# Patient Record
Sex: Female | Born: 2000 | Hispanic: Yes | Smoking: Former smoker
Health system: Southern US, Community
[De-identification: ages and names within clinical notes are randomized; demographics above are authoritative.]

## PROBLEM LIST (undated history)

## (undated) DIAGNOSIS — E669 Obesity, unspecified: Secondary | ICD-10-CM

## (undated) HISTORY — DX: Obesity, unspecified: E66.9

## (undated) HISTORY — PX: OTHER SURGICAL HISTORY: SHX169

---
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
## 2019-04-21 ENCOUNTER — Ambulatory Visit: Payer: Self-pay

## 2019-10-01 ENCOUNTER — Ambulatory Visit: Payer: Self-pay

## 2019-10-22 ENCOUNTER — Ambulatory Visit: Payer: Medicaid Other

## 2023-07-29 DIAGNOSIS — Z3483 Encounter for supervision of other normal pregnancy, third trimester: Secondary | ICD-10-CM | POA: Diagnosis not present

## 2023-12-27 ENCOUNTER — Emergency Department

## 2023-12-27 ENCOUNTER — Emergency Department
Admission: EM | Admit: 2023-12-27 | Discharge: 2023-12-27 | Disposition: A | Discharge status: Home / Self Care | Attending: Emergency Medicine | Admitting: Emergency Medicine

## 2023-12-27 ENCOUNTER — Other Ambulatory Visit: Payer: Self-pay

## 2023-12-27 DIAGNOSIS — Z3A01 Less than 8 weeks gestation of pregnancy: Secondary | ICD-10-CM | POA: Diagnosis not present

## 2023-12-27 DIAGNOSIS — O209 Hemorrhage in early pregnancy, unspecified: Secondary | ICD-10-CM | POA: Diagnosis present

## 2023-12-27 DIAGNOSIS — Z3491 Encounter for supervision of normal pregnancy, unspecified, first trimester: Secondary | ICD-10-CM

## 2023-12-27 DIAGNOSIS — O4690 Antepartum hemorrhage, unspecified, unspecified trimester: Secondary | ICD-10-CM

## 2023-12-27 LAB — CBC
HCT: 40.1 % (ref 36.0–46.0)
Hemoglobin: 13.2 g/dL (ref 12.0–15.0)
MCH: 28.8 pg (ref 26.0–34.0)
MCHC: 32.9 g/dL (ref 30.0–36.0)
MCV: 87.4 fL (ref 80.0–100.0)
Platelets: 261 10*3/uL (ref 150–400)
RBC: 4.59 MIL/uL (ref 3.87–5.11)
RDW: 12.1 % (ref 11.5–15.5)
WBC: 7.6 10*3/uL (ref 4.0–10.5)
nRBC: 0 % (ref 0.0–0.2)

## 2023-12-27 LAB — HCG, QUANTITATIVE, PREGNANCY: hCG, Beta Chain, Quant, S: 7801 m[IU]/mL — ABNORMAL HIGH (ref <5)

## 2023-12-27 LAB — POC URINE PREG, ED: Preg Test, Ur: POSITIVE — AB

## 2023-12-27 NOTE — Discharge Instructions (Addendum)
Please start taking prenatal vitamins daily.

## 2023-12-27 NOTE — ED Notes (Signed)
 Patient taken to ultrasound.

## 2023-12-27 NOTE — ED Triage Notes (Signed)
 Pt here with vaginal bleeding. Pt states she took 2 at home pregnancy test that were postive. Last LMP was October 28, 2023. Pt states she woke up this morning with spotting, denies heavy bleeding. Pt denies NVD.

## 2023-12-27 NOTE — ED Provider Notes (Signed)
 [facility] Provider Note   Event Date/Time   First MD Initiated Contact with Patient 12/27/23 1210     (approximate) History  Vaginal Bleeding  HPI Crystal Perez is a 23 y.o. female with no stated past medical history presents for vaginal bleeding that began last night into today.  Patient denies heavy bleeding.  Patient states last menstrual cycle was October 28, 2023.  Patient has taken 2 home urine pregnancy test that have come back positive.  Patient denies any abdominal pain ROS: Patient currently denies any vision changes, tinnitus, difficulty speaking, facial droop, sore throat, chest pain, shortness of breath, nausea/vomiting/diarrhea, dysuria, or weakness/numbness/paresthesias in any extremity   Physical Exam  Triage Vital Signs: ED Triage Vitals [12/27/23 1150]  Encounter Vitals Group     BP (!) 142/96     Girls Systolic BP Percentile      Girls Diastolic BP Percentile      Boys Systolic BP Percentile      Boys Diastolic BP Percentile      Pulse Rate 97     Resp 16     Temp 98 F (36.7 C)     Temp Source Oral     SpO2 100 %     Weight 169 lb 15.6 oz (77.1 kg)     Height 5' 4 (1.626 m)     Head Circumference      Peak Flow      Pain Score 4     Pain Loc      Pain Education      Exclude from Growth Chart    Most recent vital signs: Vitals:   12/27/23 1150  BP: (!) 142/96  Pulse: 97  Resp: 16  Temp: 98 F (36.7 C)  SpO2: 100%   General: Awake, oriented x4. CV:  Good peripheral perfusion. Resp:  Normal effort. Abd:  No distention. Other:  Young adult overweight Hispanic female resting comfortably in no acute distress ED Results / Procedures / Treatments  Labs (all labs ordered are listed, but only abnormal results are displayed) Labs Reviewed  HCG, QUANTITATIVE, PREGNANCY - Abnormal; Notable for the following components:      Result Value   hCG, Beta Chain, Quant, S 7,801 (*)    All other components within normal  limits  POC URINE PREG, ED - Abnormal; Notable for the following components:   Preg Test, Ur Positive (*)    All other components within normal limits  CBC  RADIOLOGY ED MD interpretation: Transvaginal ultrasound shows a single living intrauterine pregnancy with estimated gestational age of [redacted] weeks 4 days as well as a small subchorionic hemorrhage - All radiology independently interpreted and agree with radiology assessment Official radiology report(s): US  OB LESS THAN 14 WEEKS WITH OB TRANSVAGINAL Result Date: 12/27/2023 CLINICAL DATA:  Vaginal bleeding in 1st trimester pregnancy. EXAM: OBSTETRIC <14 WK US  AND TRANSVAGINAL OB US  TECHNIQUE: Both transabdominal and transvaginal ultrasound examinations were performed for complete evaluation of the gestation as well as the maternal uterus, adnexal regions, and pelvic cul-de-sac. Transvaginal technique was performed to assess early pregnancy. COMPARISON:  None Available. FINDINGS: Intrauterine gestational sac: Single Yolk sac:  Visualized. Embryo:  Visualized. Cardiac Activity: Visualized. Heart Rate: 127 bpm CRL:  7 mm   6 w   4 d                  US  EDC: 08/17/2024 Subchorionic hemorrhage:  Small subchorionic hemorrhage noted. Maternal uterus/adnexae: Retroverted uterus noted. Small  right ovarian corpus luteum noted. Normal appearance of left ovary. No adnexal mass or abnormal free fluid identified. IMPRESSION: Single living IUP with estimated gestational age of [redacted] weeks 4 days, and US  EDC of 08/17/2024. Small subchorionic hemorrhage. Electronically Signed   By: Marlyce Sine M.D.   On: 12/27/2023 14:18   PROCEDURES: Critical Care performed: No Procedures MEDICATIONS ORDERED IN ED: Medications - No data to display IMPRESSION / MDM / ASSESSMENT AND PLAN / ED COURSE  I reviewed the triage vital signs and the nursing notes.                             The patient is on the cardiac monitor to evaluate for evidence of arrhythmia and/or significant heart  rate changes. Patient's presentation is most consistent with acute presentation with potential threat to life or bodily function. Workup: CBC, BMP, UA, bHCG, Type&Screen, 1st Trimester Ultrasound  Based on History, Exam, and ED Workup patient's presentation not consistent with ectopic pregnancy, molar pregnancy, life-threatening coagulopathy, trauma, serious bacterial infection, central process or other emergency.  Disposition: Will discharge home with return precautions and instruction for prompt OBGYN follow up.   FINAL CLINICAL IMPRESSION(S) / ED DIAGNOSES   Final diagnoses:  Vaginal bleeding in pregnancy  First trimester pregnancy   Rx / DC Orders   ED Discharge Orders     None      Note:  This document was prepared using Dragon voice recognition software and may include unintentional dictation errors.   Michaell Grider K, MD 12/27/23 [phone]

## 2023-12-27 NOTE — ED Notes (Signed)
 Patient declined discharge vital signs.

## 2024-01-31 ENCOUNTER — Encounter

## 2024-02-01 ENCOUNTER — Encounter: Payer: Self-pay | Admitting: Nurse Practitioner

## 2024-02-01 ENCOUNTER — Ambulatory Visit: Payer: Self-pay

## 2024-02-01 ENCOUNTER — Ambulatory Visit: Admitting: Nurse Practitioner

## 2024-02-01 VITALS — BP 117/73 | HR 87 | Temp 97.9°F | Ht 64.0 in | Wt 201.6 lb

## 2024-02-01 DIAGNOSIS — Z3A11 11 weeks gestation of pregnancy: Secondary | ICD-10-CM | POA: Diagnosis not present

## 2024-02-01 DIAGNOSIS — O99211 Obesity complicating pregnancy, first trimester: Secondary | ICD-10-CM | POA: Diagnosis not present

## 2024-02-01 DIAGNOSIS — Z3481 Encounter for supervision of other normal pregnancy, first trimester: Secondary | ICD-10-CM

## 2024-02-01 DIAGNOSIS — O99210 Obesity complicating pregnancy, unspecified trimester: Secondary | ICD-10-CM | POA: Insufficient documentation

## 2024-02-01 DIAGNOSIS — Z3480 Encounter for supervision of other normal pregnancy, unspecified trimester: Secondary | ICD-10-CM | POA: Insufficient documentation

## 2024-02-01 DIAGNOSIS — Z8759 Personal history of other complications of pregnancy, childbirth and the puerperium: Secondary | ICD-10-CM

## 2024-02-01 LAB — WET PREP FOR TRICH, YEAST, CLUE
Clue Cell Exam: NEGATIVE
Trichomonas Exam: NEGATIVE
Yeast Exam: NEGATIVE

## 2024-02-01 LAB — HEMOGLOBIN, FINGERSTICK: Hemoglobin: 12.4 g/dL (ref 11.1–15.9)

## 2024-02-01 MED ORDER — ASPIRIN 81 MG PO TBEC: 81.0000 mg | DELAYED_RELEASE_TABLET | Freq: Every day | ORAL | Status: DC

## 2024-02-01 MED ORDER — BLOOD PRESSURE KIT: 1.0000 | PACK | Freq: Every day | 0 refills | Status: AC

## 2024-02-01 NOTE — Progress Notes (Signed)
 [facility] [address] [place] [address] Main phone: [phone]  Initial Prenatal Visit  Subjective:  Crystal Perez is a 23 y.o. G3P1011 at [redacted]w[redacted]d being seen today to start prenatal care at the [facility] Department. The following medical issues will be considered in the care of this low-risk pregnancy:   Patient Active Problem List   Diagnosis Date Noted   Prenatal care, subsequent pregnancy, antepartum 02/01/2024   Obesity affecting pregnancy, antepartum 02/01/2024   History of gestational hypertension 02/01/2024   Patient reports nausea.  Contractions: Not present. Vag. Bleeding: None.  Movement: Absent. Denies leaking of fluid.   Indications for ASA therapy One of the following: Previous pregnancy with preeclampsia, especially early onset and with an adverse outcome Yes  Multifetal gestation No  Chronic hypertension No  Type 1 or 2 diabetes mellitus No  Chronic kidney disease No  Autoimmune disease (antiphospholipid syndrome, systemic lupus erythematosus) No   Two or more of the following: Nulliparity  No  Obesity (body mass index >30 kg/m2) Yes  Family history of preeclampsia in mother or sister No  Age >=35 years No  Sociodemographic characteristics (African American race, low socioeconomic level) Yes  Personal risk factors (eg, previous pregnancy with low birth weight or small for gestational age infant, previous adverse pregnancy outcome [eg, stillbirth], interval >10 years between pregnancies) No   The following portions of the patient's history were reviewed and updated as appropriate: allergies, current medications, past family history, past medical history, past social history, past surgical history and problem list. Problem list updated.  Objective:   Vitals:   02/01/24 0848  BP: 117/73  Pulse: 87  Temp: 97.9 F (36.6 C)  Weight: 201 lb 9.6 oz (91.4 kg)   Fetal  Status: Fetal Heart Rate (bpm): 173   Movement: Absent      Physical Exam Vitals and nursing note reviewed.  Constitutional:      General: She is not in acute distress.    Appearance: Normal appearance. She is well-developed.  HENT:     Head: Normocephalic and atraumatic.     Right Ear: External ear normal.     Left Ear: External ear normal.     Nose: Nose normal. No congestion or rhinorrhea.     Mouth/Throat:     Lips: Pink.     Mouth: Mucous membranes are moist.     Dentition: Normal dentition. No dental caries.     Pharynx: Oropharynx is clear. Uvula midline.     Comments: Dentition: intact Eyes:     General: No scleral icterus.    Conjunctiva/sclera: Conjunctivae normal.  Neck:     Thyroid: No thyroid mass or thyromegaly.  Cardiovascular:     Rate and Rhythm: Normal rate.     Pulses: Normal pulses.     Comments: Extremities are warm and well perfused Pulmonary:     Effort: Pulmonary effort is normal.     Breath sounds: Normal breath sounds.  Chest:     Comments: CBE not indicated Abdominal:     General: Abdomen is flat.     Palpations: Abdomen is soft.     Tenderness: There is no abdominal tenderness.     Comments: Gravid   Genitourinary:    General: Normal vulva.     Exam position: Lithotomy position.     Pubic Area: No rash.      Labia:        Right: No rash.  Left: No rash.      Vagina: Normal. No vaginal discharge.     Cervix: Friability (mild) present.     Uterus: Enlarged (gravid). Not tender.      Rectum: Normal. No external hemorrhoid.     Comments: Swabs and pap collected Musculoskeletal:     Right lower leg: No edema.     Left lower leg: No edema.  Lymphadenopathy:     Cervical: No cervical adenopathy.  Skin:    General: Skin is warm.     Capillary Refill: Capillary refill takes less than 2 seconds.  Neurological:     Mental Status: She is alert.     Assessment and Plan:  Pregnancy: G3P1011 at [redacted]w[redacted]d  1. Prenatal care, subsequent  pregnancy, antepartum (Primary) Reviewed recommended weight gain: 11-20lbs. Discussed healthy diet (avoiding soda, good sources of protein, fruits, and vegetables, lots of water) and exercising. Endorses mild nausea in the morning. Enc frequent snacks. Rec doxylamine/vit b6. Given sheet. Pap due today and collected Last dental visit 3 years ago. Encouraged to seek dental care this pregnancy. EPDS: 0 Dating US  on 12/27/23 @ [redacted]w[redacted]d  She lives with her husband and her 5yo son, who is very excited about this pregnancy. She does not work currently; her husband is an Personnel officer.   - Prenatal Profile I - MaterniT 21 plus Core, Blood - Hgb Fractionation Cascade - Lead, blood (adult age 42 yrs or greater) - Comprehensive metabolic panel with GFR - Hemoglobin, fingerstick - WET PREP FOR TRICH, YEAST, CLUE - IGP, rfx Aptima HPV ASCU  2. [redacted] weeks gestation of pregnancy Discussed updated ED w pt  3. Obesity affecting pregnancy, antepartum, unspecified obesity type  - Glucose, 1 hour - Hgb A1c w/o eAG - TSH - Protein / creatinine ratio, urine - Amb ref to Medical Nutrition Therapy-MNT  4. History of pre-eclampsia Signs and symptoms of preeclampsia were verbally reviewed with warning signs, when and how to call. A written handout with tips on how to take your blood pressure, as well as warning signs was provided to the patient.  Sent for BP kit at home and instructed to take BP daily. Pt to start aspirin in a couple days at 12w  - aspirin EC 81 MG tablet; Take 1 tablet (81 mg total) by mouth daily. Swallow whole. - Blood Pressure KIT; 1 kit by Does not apply route daily.  Dispense: 1 kit; Refill: 0    Discussed overview of care and coordination with inpatient delivery practices including [facility],  [facility].   Reviewed [facility] pregnancy as standard of care at [facility], oriented to room and showed video. Based on EDD, plan for Cycle 6 .   Preterm labor symptoms  and general obstetric precautions including but not limited to vaginal bleeding, contractions, leaking of fluid and fetal movement were reviewed in detail with the patient.  Please refer to After Visit Summary for other counseling recommendations.   Return in about 4 weeks (around 02/29/2024) for Routine PNC.  No future appointments.  Miliana Gangwer K Bennetta Rudden, NP

## 2024-02-01 NOTE — Progress Notes (Signed)
 Presents for initiation of prenatal care. 12/27/23 [facility] ED evaluation for vaginal bleeding and per client, no longer having any bleeding. US  and bhcg done at that evaluation. [place]  and denies any international travel. Desires MaterniT-21 testing today. All demographic and contact info verified as correct. Verified with client that has voicemail set up on her cell phone. Client reports Covid vaccine x2 at [facility] in 2021 (record not available). Declined vaccine today. Left arm measurement = 36 cm and needs large adult BP cuff for readings. Burnadette Lowers, RN Hgb = 12.4 and wet prep negative. No intervention required per standing order. Aspirin dispensed as per order and counseled to take 1 per day with verbalized understanding. Blood Pressure Kit #16 dispensed as ordered. Cuff in kit replaced with BP cuff measuring 22 - 42 cm. BP demo completed with client verbalizing understanding. Client counseled to bring BP log at each visit and agrees to do so. Counseled regarding CenteringPregnancy and elects to participate in Cycle 6 as in pm (Client speaks / reads [place]). Burnadette Lowers, RN

## 2024-02-02 LAB — PROTEIN / CREATININE RATIO, URINE
Creatinine, Urine: 242.6 mg/dL
Protein, Ur: 18.6 mg/dL
Protein/Creat Ratio: 77 mg/g{creat} (ref 0–200)

## 2024-02-05 LAB — IGP, RFX APTIMA HPV ASCU: PAP Smear Comment: 0

## 2024-02-06 ENCOUNTER — Encounter: Payer: Self-pay | Admitting: Family Medicine

## 2024-02-06 DIAGNOSIS — R87619 Unspecified abnormal cytological findings in specimens from cervix uteri: Secondary | ICD-10-CM | POA: Insufficient documentation

## 2024-02-06 NOTE — Progress Notes (Signed)
 PAP smear reviewed, result: ASC-H, HPV co-testing not performed. Based on this result and patient's prior cervical cancer screenings, ASCCP currently recommends colposcopy.  Dorothyann Helling, MD 02/06/24  3:59 PM

## 2024-02-08 ENCOUNTER — Telehealth: Payer: Self-pay | Admitting: Family Medicine

## 2024-02-09 ENCOUNTER — Telehealth: Payer: Self-pay

## 2024-02-09 NOTE — Telephone Encounter (Signed)
 Call to [facility] and spoke with [place] as majority of labs drawn at new OB appt 02/01/24 not yet available in EPIC. Per Rowena, MaternitT-21 result is pending which is why other labs have not yet been sent. As client has [facility] appt in [facility] on 02/11/24, Candy faxed available lab results not yet in EPIC so provider will have for review at client's appt. Burnadette Lowers, RN

## 2024-02-10 LAB — PREGNANCY, INITIAL SCREEN
Antibody Screen: NEGATIVE
Basophils Absolute: 0 x10E3/uL (ref 0.0–0.2)
Basos: 0 %
Bilirubin, UA: NEGATIVE
Chlamydia trachomatis, NAA: NEGATIVE
EOS (ABSOLUTE): 0.2 x10E3/uL (ref 0.0–0.4)
Eos: 3 %
Glucose, UA: NEGATIVE
HCV Ab: NONREACTIVE
HIV Screen 4th Generation wRfx: NONREACTIVE
Hematocrit: 38.6 % (ref 34.0–46.6)
Hemoglobin: 12.5 g/dL (ref 11.1–15.9)
Hepatitis B Surface Ag: NEGATIVE
Immature Grans (Abs): 0 x10E3/uL (ref 0.0–0.1)
Immature Granulocytes: 0 %
Ketones, UA: NEGATIVE
Leukocytes,UA: NEGATIVE
Lymphocytes Absolute: 2.1 x10E3/uL (ref 0.7–3.1)
Lymphs: 27 %
MCH: 29.7 pg (ref 26.6–33.0)
MCHC: 32.4 g/dL (ref 31.5–35.7)
MCV: 92 fL (ref 79–97)
Monocytes Absolute: 0.4 x10E3/uL (ref 0.1–0.9)
Monocytes: 5 %
Neisseria Gonorrhoeae by PCR: NEGATIVE
Neutrophils Absolute: 5.3 x10E3/uL (ref 1.4–7.0)
Neutrophils: 65 %
Nitrite, UA: NEGATIVE
Platelets: 252 x10E3/uL (ref 150–450)
RBC, UA: NEGATIVE
RBC: 4.21 x10E6/uL (ref 3.77–5.28)
RDW: 12.4 % (ref 11.7–15.4)
RPR Ser Ql: NONREACTIVE
Rh Factor: POSITIVE
Rubella Antibodies, IGG: 1.97 {index} (ref >0.99)
Specific Gravity, UA: >=1.03 — AB (ref 1.005–1.030)
Urobilinogen, Ur: 0.2 mg/dL (ref 0.2–1.0)
WBC: 8.1 x10E3/uL (ref 3.4–10.8)
pH, UA: 5.5 (ref 5.0–7.5)

## 2024-02-10 LAB — COMPREHENSIVE METABOLIC PANEL WITH GFR
ALT: 11 IU/L (ref 0–32)
AST: 12 IU/L (ref 0–40)
Albumin: 4.2 g/dL (ref 4.0–5.0)
Alkaline Phosphatase: 65 IU/L (ref 44–121)
BUN/Creatinine Ratio: 12 (ref 9–23)
BUN: 5 mg/dL — ABNORMAL LOW (ref 6–20)
Bilirubin Total: <0.2 mg/dL (ref 0.0–1.2)
CO2: 19 mmol/L — ABNORMAL LOW (ref 20–29)
Calcium: 8.9 mg/dL (ref 8.7–10.2)
Chloride: 102 mmol/L (ref 96–106)
Creatinine, Ser: 0.43 mg/dL — ABNORMAL LOW (ref 0.57–1.00)
Globulin, Total: 3 g/dL (ref 1.5–4.5)
Glucose: 118 mg/dL — ABNORMAL HIGH (ref 70–99)
Potassium: 3.7 mmol/L (ref 3.5–5.2)
Sodium: 136 mmol/L (ref 134–144)
Total Protein: 7.2 g/dL (ref 6.0–8.5)
eGFR: 140 mL/min/1.73 (ref >59)

## 2024-02-10 LAB — HGB FRACTIONATION CASCADE
Hgb A2: 2.7 % (ref 1.8–3.2)
Hgb A: 96.7 % (ref 96.4–98.8)
Hgb F: 0.6 % (ref 0.0–2.0)
Hgb S: 0 %

## 2024-02-10 LAB — MICROSCOPIC EXAMINATION
Bacteria, UA: NONE SEEN
Casts: NONE SEEN /LPF

## 2024-02-10 LAB — HCV INTERPRETATION

## 2024-02-10 LAB — GLUCOSE, 1 HOUR GESTATIONAL: Gestational Diabetes Screen: 113 mg/dL (ref 70–139)

## 2024-02-10 LAB — HGB A1C W/O EAG: Hgb A1c MFr Bld: 5.2 % (ref 4.8–5.6)

## 2024-02-10 LAB — MATERNIT 21 PLUS CORE, BLOOD

## 2024-02-10 LAB — URINE CULTURE, OB REFLEX

## 2024-02-10 LAB — LEAD, BLOOD (ADULT >= 16 YRS): Lead-Whole Blood: <1 ug/dL (ref 0.0–3.4)

## 2024-02-10 LAB — TSH: TSH: 2.22 u[IU]/mL (ref 0.450–4.500)

## 2024-02-11 ENCOUNTER — Telehealth: Payer: Self-pay

## 2024-02-11 ENCOUNTER — Ambulatory Visit: Admitting: Family Medicine

## 2024-02-11 VITALS — BP 132/84 | HR 93 | Wt 201.2 lb

## 2024-02-11 DIAGNOSIS — Z3482 Encounter for supervision of other normal pregnancy, second trimester: Secondary | ICD-10-CM

## 2024-02-11 DIAGNOSIS — O99212 Obesity complicating pregnancy, second trimester: Secondary | ICD-10-CM

## 2024-02-11 DIAGNOSIS — O99210 Obesity complicating pregnancy, unspecified trimester: Secondary | ICD-10-CM

## 2024-02-11 DIAGNOSIS — Z3480 Encounter for supervision of other normal pregnancy, unspecified trimester: Secondary | ICD-10-CM

## 2024-02-11 DIAGNOSIS — Z3A13 13 weeks gestation of pregnancy: Secondary | ICD-10-CM

## 2024-02-11 DIAGNOSIS — R030 Elevated blood-pressure reading, without diagnosis of hypertension: Secondary | ICD-10-CM | POA: Insufficient documentation

## 2024-02-11 NOTE — Progress Notes (Signed)
 Reviewed prenatal labs. CfDNA cancelled, but recollected 7/28. Otherwise unremarkable initial prenatal labs.   Dorothyann Helling, MD 02/11/24  3:44 PM

## 2024-02-11 NOTE — Assessment & Plan Note (Signed)
 BP elevated 132/84. TWG 1 lb 3.2 oz (0.544 kg). Taking prenatal vitamin daily and aspirin  81 mg daily. Next prenatal appointment in 4 weeks .  - continue to monitor BP - cfDNA and carrier screening today (too early at last appt) - paper referral faxed to [facility] for anatomy US 

## 2024-02-11 NOTE — Progress Notes (Unsigned)
 Per client, has not yet received call from [facility] nutritionist in repsonse to 02/01/24 MNT referral. BP log copied and in outguide for provider review. Verified has [facility] contact card. Due to problem with testing at [employer] of MaternitT-21 specimen from new OB appt, needs to be re-drawn. Genetic screening tests ordered today per Dr Macario verbal order. Burnadette Lowers, RN

## 2024-02-11 NOTE — Progress Notes (Unsigned)
 [facility] [address] [place] [address] Main phone: [phone]  Prenatal Visit  Subjective:  Crystal Perez is a 23 y.o. G3P1011 at [redacted]w[redacted]d being seen today for ongoing prenatal care.  She is currently monitored for the following issues for this low-risk pregnancy:   Patient Active Problem List   Diagnosis Date Noted   Elevated BP without diagnosis of hypertension 02/11/2024   Abnormal Pap smear of cervix 02/06/2024   Prenatal care, subsequent pregnancy, antepartum 02/01/2024   Obesity affecting pregnancy, antepartum 02/01/2024   History of pre-eclampsia 02/01/2024   Patient reports mild nausea an vomiting.  Contractions: Not present. Vag. Bleeding: None.  Movement: Present. Denies leaking of fluid/ROM.   The following portions of the patient's history were reviewed and updated as appropriate: allergies, current medications, past family history, past medical history, past social history, past surgical history and problem list. Problem list updated.  Objective:   Vitals:   02/11/24 1415  BP: 132/84  Pulse: 93  Weight: 201 lb 3.2 oz (91.3 kg)   Fetal Status: Fetal Heart Rate (bpm): 130 Fundal Height: 20 cm (at umbilicus) Movement: Present     General:  Alert, oriented and cooperative. Patient is in no acute distress.  Skin: Skin is warm and dry. No rash noted.   Cardiovascular: Normal heart rate noted  Respiratory: Normal respiratory effort, no problems with respiration noted  Abdomen: Soft, gravid, appropriate for gestational age.  Pain/Pressure: Absent     Pelvic: Cervical exam deferred        Extremities: Normal range of motion.     Mental Status: Normal mood and affect. Normal behavior. Normal judgment and thought content.   Assessment and Plan:  Pregnancy: G3P1011 at [redacted]w[redacted]d  [redacted] weeks gestation of pregnancy  Prenatal care, subsequent pregnancy, antepartum Assessment & Plan: BP  elevated 132/84. TWG 1 lb 3.2 oz (0.544 kg). Taking prenatal vitamin daily and aspirin  81 mg daily. Next prenatal appointment in 4 weeks .  - continue to monitor BP - cfDNA and carrier screening today (too early at last appt) - paper referral faxed to [facility] for anatomy US    Orders: -     MaterniT21  plus Core+ESS+SCA, Blood -     Inheritest 300 PLUS Panel  Obesity affecting pregnancy, antepartum, unspecified obesity type Assessment & Plan: Tolerating aspirin  81 mg daily.  Nutrition referral sent, but no appointment made yet.    Elevated BP without diagnosis of hypertension   Centering Pregnancy, Session#1: Introduction to model of care. Group determined rules for self-governance and closing phrase. Oriented group to space and mother's notebook.   Facilitated discussion today:  nutrition and oral health.  Mindfulness activity completed as well as introduction to deep breathing for childbirth preparation.    Fundal height and FHR appropriate today unless noted otherwise in plan. Patient to continue group care.   Preterm labor symptoms and general obstetric precautions including but not limited to vaginal bleeding, contractions, leaking of fluid and fetal movement were reviewed in detail with the patient.  Please refer to After Visit Summary for other counseling recommendations.  No follow-ups on file.  Future Appointments  Date Time Provider Department Center  03/10/2024  1:30 PM [facility] PROVIDER AC-MAT None  04/07/2024  1:35 PM [facility] PROVIDER AC-MAT None  05/05/2024  1:30 PM [facility] PROVIDER AC-MAT None  05/19/2024  1:30 PM [facility] PROVIDER AC-MAT None  06/02/2024  1:30 PM AC-[facility] PROVIDER AC-MAT None  06/16/2024  1:30 PM AC-MH  PROVIDER AC-MAT None  06/30/2024  1:30 PM [facility] PROVIDER AC-MAT None  07/14/2024  1:30 PM AC-[facility] PROVIDER AC-MAT None   Betsey CHRISTELLA Helling, MD

## 2024-02-11 NOTE — Telephone Encounter (Signed)
 Call pt re pap results from 02/01/24 specimen and colpo referral. Atypical, cannot exclude high grade squamous intraep lesion; No HPV  Confirm insurance and where pt would like to go for colpo.

## 2024-02-12 NOTE — Telephone Encounter (Signed)
 02/12/24 Colpo referral sent to Bari Silversmith, [place], through [employer] (Sent after 5 pm).

## 2024-02-12 NOTE — Assessment & Plan Note (Signed)
 Tolerating aspirin  81 mg daily.  Nutrition referral sent, but no appointment made yet.

## 2024-02-12 NOTE — Telephone Encounter (Signed)
 Phone call to pt at [phone]. Pt answered and confirmed identity. Counseled pt re pap result and colpo referral.  Pt states she only has Family planning medicaid, currently pregnant. Discussed [facility] as resource for uninsured pts and family medicaid has not covered colpos in the past. Pt interested in being seen through [employer] and understands [facility] to determine if status qualifies for program.

## 2024-02-21 ENCOUNTER — Telehealth: Payer: Self-pay | Admitting: Family Medicine

## 2024-02-21 ENCOUNTER — Ambulatory Visit: Payer: Self-pay | Admitting: Family Medicine

## 2024-02-21 DIAGNOSIS — Z3480 Encounter for supervision of other normal pregnancy, unspecified trimester: Secondary | ICD-10-CM

## 2024-02-21 LAB — MATERNIT21  PLUS CORE+ESS+SCA, BLOOD

## 2024-02-21 LAB — BEACON CARRIER EXPD GENE 427

## 2024-02-21 NOTE — Telephone Encounter (Signed)
-----   Message from Nurse Jolynn R sent at 02/21/2024  7:57 AM EDT -----  ----- Message ----- From: Interface, [facility] Lab Results In Sent: 02/21/2024   5:37 AM EDT To: [facility]-Results

## 2024-02-21 NOTE — Telephone Encounter (Signed)
 Called patient to discuss positive carrier screening. No answer, left HIPAA safe VM.   Will refer to [facility] for genetics counseling and partner testing.   Dorothyann Helling, MD 02/21/24  9:39 AM

## 2024-02-21 NOTE — Progress Notes (Signed)
 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 cfDNA negtaive, c/w female fetus.  Will call to discuss.   Dorothyann Helling, MD 02/21/24  9:31 AM

## 2024-02-21 NOTE — Telephone Encounter (Signed)
 Confirmed correct person by name and DOB.   Spoke with Crystal Perez regarding her cfDNA and carrier screening results. Discussed in general terms what this means and the referral to [facility] M[facility] genetic counseling. Recommended both she and her partner go to genetic counseling appointment so her partner may be involved and undergo screening, too. Provided the [facility] genetic counseling consultation phone number.   Dorothyann Helling, MD 02/21/24  4:54 PM

## 2024-02-21 NOTE — Telephone Encounter (Signed)
 Colpo referral received and being reviewed by [facility]: Ann Bari JONETTA Loreda Treva, RN Good morning Treva, I will get our provider to review the lab results and advise for scheduling. Thank you for the referral,

## 2024-03-03 ENCOUNTER — Encounter: Payer: Self-pay | Admitting: Family Medicine

## 2024-03-03 DIAGNOSIS — Z148 Genetic carrier of other disease: Secondary | ICD-10-CM | POA: Insufficient documentation

## 2024-03-03 DIAGNOSIS — Z1501 Genetic susceptibility to malignant neoplasm of breast: Secondary | ICD-10-CM | POA: Insufficient documentation

## 2024-03-03 DIAGNOSIS — R898 Other abnormal findings in specimens from other organs, systems and tissues: Secondary | ICD-10-CM | POA: Insufficient documentation

## 2024-03-03 DIAGNOSIS — Z9189 Other specified personal risk factors, not elsewhere classified: Secondary | ICD-10-CM | POA: Insufficient documentation

## 2024-03-04 NOTE — Addendum Note (Signed)
 Addended by: Lakishia Bourassa on: 03/04/2024 03:38 PM   Modules accepted: Orders

## 2024-03-07 ENCOUNTER — Telehealth: Payer: Self-pay | Admitting: Family Medicine

## 2024-03-10 ENCOUNTER — Ambulatory Visit: Admitting: Family Medicine

## 2024-03-10 ENCOUNTER — Ambulatory Visit

## 2024-03-10 VITALS — BP 110/69 | HR 104 | Wt 202.2 lb

## 2024-03-10 DIAGNOSIS — Z3A17 17 weeks gestation of pregnancy: Secondary | ICD-10-CM

## 2024-03-10 DIAGNOSIS — O99212 Obesity complicating pregnancy, second trimester: Secondary | ICD-10-CM

## 2024-03-10 DIAGNOSIS — Z3482 Encounter for supervision of other normal pregnancy, second trimester: Secondary | ICD-10-CM

## 2024-03-10 DIAGNOSIS — Z3480 Encounter for supervision of other normal pregnancy, unspecified trimester: Secondary | ICD-10-CM

## 2024-03-10 DIAGNOSIS — R898 Other abnormal findings in specimens from other organs, systems and tissues: Secondary | ICD-10-CM

## 2024-03-10 DIAGNOSIS — O99210 Obesity complicating pregnancy, unspecified trimester: Secondary | ICD-10-CM

## 2024-03-10 DIAGNOSIS — R87611 Atypical squamous cells cannot exclude high grade squamous intraepithelial lesion on cytologic smear of cervix (ASC-H): Secondary | ICD-10-CM

## 2024-03-10 DIAGNOSIS — Z8759 Personal history of other complications of pregnancy, childbirth and the puerperium: Secondary | ICD-10-CM

## 2024-03-10 NOTE — Assessment & Plan Note (Signed)
 S/p genetic counseling appointment 8/12 @ [facility]. Partner has undergone testing. They will meet again once his results are back.

## 2024-03-10 NOTE — Assessment & Plan Note (Signed)
 Tolerating aspirin  81 mg.

## 2024-03-10 NOTE — Assessment & Plan Note (Signed)
BP normal today

## 2024-03-10 NOTE — Assessment & Plan Note (Signed)
 Patient reports she now has full Medicaid and does not qualify for BCCCP program. Referred to [facility] for colposcopy. May defer until postpartum period (08/17/2024). Discussed with patient.

## 2024-03-10 NOTE — Assessment & Plan Note (Signed)
 Doing well. BP normal. TWG 2 lb 3.2 oz (0.998 kg). Taking prenatal vitamin daily and aspirin  81 mg daily. Next prenatal appointment in 4 weeks . Anatomy US  scheduled for 04/16/24.

## 2024-03-10 NOTE — Progress Notes (Addendum)
 Client reports took Unisom with Vitamin B6 x1, but did not like how it made her feel (as client does not plan to take again, not added to medication list). Reports took BP x3 since last appt. Denies problems when BP taken. States just took to see what the numbers were. Requested she bring BP log to each maternity appt .Verified has [facility] contact card. Did not keep [facility] video appt 03/06/24 with nutrition services. States has not yet received call from [facility] nutritionist. Kept 02/26/24 genetic counseling appt and aware of 04/16/24 [facility] US  appt at [place]. Client given US  appt reminder and [place] address. Client aware colpo referral is with [facility]CP program. Counseled current Medicaid coverage may require her to have colpo at [facility], but have not yet heard back from [facility] program. Plans AFP today after [facility]regnancy group ends. Burnadette Lowers, RN

## 2024-03-10 NOTE — Progress Notes (Signed)
 [facility] [address] [place] [address] Main phone: [phone]  Prenatal Visit  Subjective:  Crystal Perez is a 23 y.o. G3P1011 at [redacted]w[redacted]d being seen today for ongoing prenatal care.  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  Contractions: Not present. Vag. Bleeding: None.  Movement: Present (Flutters a couple times). Denies leaking of fluid/ROM.   The following portions of the patient's history were reviewed and updated as appropriate: allergies, current medications, past family history, past medical history, past social history, past surgical history and problem list. Problem list updated.  Objective:   Vitals:   03/10/24 1345  BP: 110/69  Pulse: (!) 104  Weight: 202 lb 3.2 oz (91.7 kg)   Fetal Status: Fetal Heart Rate (bpm): 130 Fundal Height:  (halfway to umbilicus) Movement: Present (Flutters a couple times)     General:  Alert, oriented and cooperative. Patient is in no acute distress.  Skin: Skin is warm and dry. No rash noted.   Cardiovascular: Normal heart rate noted  Respiratory: Normal respiratory effort, no problems with respiration noted  Abdomen: Soft, gravid, appropriate for gestational age.  Pain/Pressure: Absent     Pelvic: Cervical exam deferred        Extremities: Normal range  of motion.     Mental Status: Normal mood and affect. Normal behavior. Normal judgment and thought content.   Assessment and Plan:  Pregnancy: G3P1011 at [redacted]w[redacted]d  [redacted] weeks gestation of pregnancy  Prenatal care, subsequent pregnancy, antepartum Assessment & Plan: Doing well. BP normal. TWG 2 lb 3.2 oz (0.998 kg). Taking prenatal vitamin daily and aspirin  81 mg daily. Next prenatal appointment in 4 weeks . Anatomy US  scheduled for 04/16/24.   Orders: -     AFP, Serum, Open Spina Bifida  Obesity affecting pregnancy, antepartum, unspecified obesity type Assessment & Plan: Tolerating aspirin  81 mg.    History of pre-eclampsia  Abnormal genetic test Assessment & Plan: S/p genetic counseling appointment 8/12 @ [facility]. Partner has undergone testing. They will meet again once his results are back.    Atypical squamous cells cannot exclude high grade squamous intraepithelial lesion on cytologic smear of cervix (ASC-H) Assessment & Plan: Patient reports she now has full Medicaid and does not qualify for BCCCP program. Referred to [facility] for colposcopy. May defer until postpartum period (08/17/2024). Discussed with patient.  Orders: -     Ambulatory referral to Obstetrics / Gynecology  Centering Pregnancy, Session#2: Reviewed rules for self-governance with group. Direct group to [employer].   Facilitated discussion today:  Oral Health and dental care in pregnancy,  common discomforts of pregnancy, Genetic screening options with AFP/Quad.  Mindfulness activity completed as well as deep breathing with 1 minutes of tension and 1 minute of relaxation for childbirth preparation.   Activity-demonstrated exercises to alleviate back pain  Fundal height and FHR appropriate today unless noted otherwise in plan. Patient to  continue group care.   Preterm labor symptoms and general obstetric precautions including but not limited to vaginal bleeding, contractions, leaking of fluid and fetal  movement were reviewed in detail with the patient. Please refer to After Visit Summary for other counseling recommendations.  No follow-ups on file.  Future Appointments  Date Time Provider Department Center  04/07/2024  1:35 PM [facility] PROVIDER AC-MAT None  05/05/2024  1:30 PM [facility] PROVIDER AC-MAT None  05/19/2024  1:30 PM AC-MH PROVIDER AC-MAT None  06/02/2024  1:30 PM AC-MH PROVIDER AC-MAT None  06/16/2024  1:30 PM AC-MH PROVIDER AC-MAT None  06/30/2024  1:30 PM AC-MH PROVIDER AC-MAT None  07/14/2024  1:30 PM AC[facility]MH PROVIDER AC-MAT None   Betsey CHRISTELLA Helling, MD

## 2024-03-12 LAB — AFP, SERUM, OPEN SPINA BIFIDA
AFP MoM: 1.03
AFP Value: 32.9 ng/mL
Gest. Age on Collection Date: 17.1 wk
Maternal Age At EDD: 24.3 a
OSBR Risk 1 IN: 10000
Test Results:: NEGATIVE
Weight: 202 [lb_av]

## 2024-03-13 ENCOUNTER — Ambulatory Visit: Payer: Self-pay | Admitting: Family Medicine

## 2024-03-13 DIAGNOSIS — Z3480 Encounter for supervision of other normal pregnancy, unspecified trimester: Secondary | ICD-10-CM

## 2024-03-13 NOTE — Progress Notes (Signed)
 AFP negative.   Crystal Helling, MD 03/13/24  11:12 AM

## 2024-03-13 NOTE — Addendum Note (Signed)
 Addended by: Minoru Chap on: 03/13/2024 10:06 AM   Modules accepted: Orders

## 2024-03-13 NOTE — Progress Notes (Signed)
 [facility] Outpatient Nutrition Services  Medical Nutrition Therapy Consultation   Visit Type:    Initial Assessment  Referral Reason:   Obesity, unspecified class, unspecified obesity type, unspecified whether serious comorbidity present  Referring Provider:                 Dorothyann CHRISTELLA Macario Crystal Perez is a 23 y.o. G2P1001 at [redacted]w[redacted]d who presented for medical nutrition therapy.  Estimated Date of Delivery: 08/17/24  Her active problem list, medication list, and lab results were reviewed.   Problem List[1] Past Medical History[2] Social History [3]  Anthropometrics  Pre-pregnancy weight: 200 lb- pt reported  Pre-pregnancy body mass index: 34.4 kg/m Current body weight:  202 lb- pt reported Height: 5'4 - pt reported   Wt Readings from Last 5 Encounters:  No data found for Wt    IOM target weight gain: 11-20 lb   Total weight gain during pregnancy: 2 lb  Nutrition Risk Screening:  Food Insecurity: No Food Insecurity (02/01/2024)   Received from [facility]   Hunger Vital Sign   . Within the past 12 months, you worried that your food would run out before you got the money to buy more.: Never true   . Within the past 12 months, the food you bought just didn't last and you didn't have money to get more.: Never true   Food Safety and Access: Have you worried about running out of food in the past year? No. Has [facility] appointment tomorrow  Nutrition Focused Physical Exam:   Unable to assess/virtual visit  Malnutrition Screening:    Unable to assess/virtual visit   Biochemical Data, Medical Tests and Procedures: All pertinent labs and imaging reviewed by Crystal Perez, RD/LDN at time of visit.  Lab Results  Component Value Date   WBC 10.7 04/23/2018   RBC 4.17 04/23/2018   HGB 11.4 (L) 04/23/2018   HCT 34.5 (L) 04/23/2018   MCV 87.4 12/27/2023   MCH 27.5 04/23/2018   MCHC 33.2 04/23/2018   RDW 15.7 (H) 04/23/2018   PLT 259 04/23/2018   MPV 8.0  04/23/2018   Lab Results  Component Value Date   A1C 5.2 02/01/2024   No results found for requested labs within last 360 days.    Medications and Vitamin/Mineral Supplementation:  All nutritionally pertinent medications reviewed on today She is taking prenatal multivitamin that has iron.   Current Medications[4]  Nutrition History:   Dietary Restrictions: No known food allergies or food intolerances.   Gastrointestinal Issues: Heartburn with spicy foods  Hunger and Satiety: reports appetite is low, if she eats a lot, will cause nausea    Diet Recall:  Time Intake  Breakfast Yoplait yogurt with granola, water or orange juice  Snack (AM) Apple   Lunch 2 quesadillas on flour tortillas, water  Snack (PM) Banana or Ritz crackers   Dinner Chicken alfredo, water  Snack (HS) None Wakes up hungry often around 2-3 am, sometimes eats, sometimes just goes back to sleep   Food-Related History: Beverages:  water, some juice, 1-2 sodas/week, coffee once/week Dining Out: 1-2 times/week  Meal Preparation: self Cravings: none Pica: none  Physical Activity:  walking, taking son to the park  Estimated Nutritional Needs: Iron: 27 mg/day       Calcium: 1000-1300 mg/day Protein: 71-113 g/day Fluid: 3 L/day Daily Carbohydrate Pattern:  Breakfast: 2 servings,  Snack: 1 serving, Lunch 3 servings, Snack 1-2 servings, Dinner 3 servings, Snack 1-2 servings  Nutrition Goals & Evaluation    Meet estimated nutrient needs on a healthy balanced diet (New) Achieve recommended weight gain for pregnancy per IOM Guidelines (New) Meet physical activity goals for pregnancy (New)  Nutrition goals reviewed, and relevant barriers identified and addressed: none evident. She is evaluated to have good willingness and ability to achieve nutrition goals.   Nutrition Assessment     Crystal Perez presents with food and nutrition-related knowledge deficit related to dietary lifestyle as  evidenced by patient report of not knowing how to modify dietary practices and behaviors to achieve meeting nutrient needs during pregnancy.  Nutrition assessment indicates that she is not meeting her nutrient needs as evidenced by food and nutrient history. Diet appears to be limited in fiber, and lacking in protein at snacks. Total weight gain is within target for gestational age.   Nutrition Intervention    - Nutrition Education: balanced nutrition for pregnancy. Education resource(s) or methods provided:: Handouts promoting nutrition intervention   Materials Provided: Pregnancy Nutrition Therapy - sent via MyChart  Nutrition Plan:  -Spread food intake into 3 small to moderate meals and 3 snacks  -Add protein to snacks -Continue exercise as able/as tolerated. Aim for 30 minutes of low impact exercise (i.e. walking) at least 5 times/week, if not contraindicated.  -Increase intake of fiber-rich foods  Follow-up: as needed  Effectiveness of nutrition interventions will be assessed at time of follow-up.    Recommendations for Clinical Team:  Please encourage nutrition plan. Please refer for follow-up if warranted.     Patient referred to outpatient nutrition services as part of therapy/treatment plans.   Time spent 28 minutes   The patient reports they are physically located in [place]  and is currently: at home. I conducted a audio/video visit. I spent  31m 02s on the video call with the patient. I spent an additional 15 minutes on pre- and post-visit activities on the date of service .     I am located on-site and the patient is located off-site for this visit.         [1] Patient Active Problem List Diagnosis  . Pre-eclampsia in third trimester (HHS-HCC)  . Vaginal delivery (HHS-HCC)  . Encounter for procreative genetic counseling  [2] Past Medical History: Diagnosis Date  . Gestational hypertension (HHS-HCC)   . Pre-eclampsia in third trimester (HHS-HCC)  04/24/2018  [3] Social History Socioeconomic History  . Marital status: Single  Tobacco Use  . Smoking status: Unknown  Substance and Sexual Activity  . Alcohol use: Not Currently  . Drug use: Never   Social Drivers of Corporate investment banker Strain: Low Risk  (02/01/2024)   Received from [facility]   Overall Financial Resource Strain (CARDIA)   . How hard is it for you to pay for the very basics like food, housing, medical care, and heating?: Not very hard  Food Insecurity: No Food Insecurity (02/01/2024)   Received from [facility]   Hunger Vital Sign   . Within the past 12 months, you worried that your food would run out before you got the money to buy more.: Never true   . Within the past 12 months, the food you bought just didn't last and you didn't have money to get more.: Never true  Transportation Needs: No Transportation Needs (02/01/2024)   Received from [facility] - Transportation   . In the past 12 months, has lack of transportation kept you from medical appointments or from getting  medications?: No   . In the past 12 months, has lack of transportation kept you from meetings, work, or from getting things needed for daily living?: No  Housing: Unknown (02/22/2024)   Housing   . Within the past 12 months, have you ever stayed: outside, in a car, in a tent, in an overnight shelter, or temporarily in someone else's home (i.e. couch-surfing)?: No  [4] Current Outpatient Medications  Medication Sig Dispense Refill  . etonogestrel (NEXPLANON) 68 mg Impl 1 each (68 mg total) by Subdermal route once. for 1 dose  0  . ibuprofen (ADVIL,MOTRIN) 600 MG tablet Take 1 tablet (600 mg total) by mouth every six (6) hours as needed.  0  . multivitamin, prenatal, folic acid-iron, 27-1 mg Tab Take 1 tablet by mouth daily.     No current facility-administered medications for this visit.

## 2024-03-14 NOTE — Telephone Encounter (Signed)
 Dr. Macario completed referral through Epic to [facility] on 03/10/24.  (Updated insurance information received: From: Butler Regulus, RN  Sent: 03/10/2024   8:27 AM EDT  Subject: Colpo ([facility]CP referral)                        .SABRASABRABCCCP referral was done on client for a colpo. At the time she did not have Medicaid. I checked with Annitta Marek in Finance this am (03/10/24) and verified she now has [employer] Medicaid....  03/25/24 appt scheduled with [facility].

## 2024-03-14 NOTE — Progress Notes (Signed)
 Referring Provider: [facility]  HPI:  Crystal Perez is a 23 y.o.  G3P1011  who presents today for evaluation and management of abnormal cervical cytology.  She is currently [redacted]w[redacted]d pregnant.  Prior pap smears:  Date:02/01/24  Pap: ASC-H  HPV: not tested   Prior cervical / vaginal findings: N/A  Prior cervical treatment(s): N/A  Symptoms/History:  -Abnormal vaginal discharge: none -Postmenopausal: no -Intermenstrual bleeding: none -Postcoital bleeding: none -Bleeding problems (non-gyn): none -Contraception: pregnant -Number of current sexual partners: 1 -Number of partners in lifetime: 5 -History of a high risk partner: none -History of STDs: none -Smoking: no -Gardasil Vaccine: unsure      ROS:  Pertinent items are noted in HPI.  OB History  Gravida Para Term Preterm AB Living  3 1 1  0 1 1  SAB IAB Ectopic Multiple Live Births  0 0 0 0 1    # Outcome Date GA Lbr Len/2nd Weight Sex Type Anes PTL Lv  3 Current           2 AB 06/2021 [redacted]w[redacted]d         1 Term 04/24/18 [redacted]w[redacted]d  7 lb 1.4 oz (3.215 kg) M Vag-Spont   LIV     Birth Comments: IOL due to gHTN    Past Medical History:  Diagnosis Date   Gestational hypertension 2019   Obesity     Past Surgical History:  Procedure Laterality Date   Denies surgical history      SOCIAL HISTORY:  Social History   Substance and Sexual Activity  Alcohol Use Not Currently   Comment: 02/01/24 - Last ETOH use was one year ago    Social History   Substance and Sexual Activity  Drug Use Not Currently   Types: Marijuana   Comment: Last marijuana use at age 18 years     Family History  Problem Relation Age of Onset   Gallbladder disease Mother    Healthy Father    Healthy Brother    Healthy Brother    Healthy Son    Healthy Maternal Aunt    Healthy Maternal Grandmother    Healthy Maternal Grandfather    Healthy Paternal Grandmother    Healthy Half-Sister    Healthy Half-Sister    Healthy Half-Sister     Healthy Half-Brother     ALLERGIES:  Patient has no known allergies.  She has a current medication list which includes the following prescription(s): aspirin  ec, blood pressure, and multivitamin-prenatal.  Physical Exam: -Vitals:  LMP 10/28/2023 (Exact Date)   PROCEDURE: Colposcopy performed with 4% acetic acid and Lugol's after informed consent obtained.  Physical Exam Vitals and nursing note reviewed. Exam conducted with a chaperone present.  Constitutional:      Appearance: Normal appearance.  HENT:     Head: Normocephalic and atraumatic.  Eyes:     Extraocular Movements: Extraocular movements intact.  Pulmonary:     Effort: Pulmonary effort is normal.  Abdominal:     Comments: Gravid  Genitourinary:    Vagina: Normal.        Comments: RED = acetowhite lesions BLUE = biopsies  Neurological:     General: No focal deficit present.     Mental Status: She is alert.  Psychiatric:        Mood and Affect: Mood normal.                   -Aceto-white Lesions Location(s): See above              -  Biopsy performed at 6 o'clock               -ECC indicated and performed: No. Pt currently pregnant     -Biopsy sites made hemostatic with pressure and Monsel's solution   -Satisfactory colposcopy: Yes.      -Evidence of Invasive cervical CA :  NO  ASSESSMENT:  Crystal Perez is a 23 y.o. G3P1011 with ASC-H without HPV testing on recent pap with [facility] (02/01/24), here for colposcopy today, performed as above without complications.  -ECC and 1 cervical bx sent to pathology -Aftercare instructions for home reviewed, si/sx of when to call/return discussed. -Gardasil counseling done, consider after pregnancy completed -Discussed possible outcomes based on pathology; will call with results   Estil Mangle, DO [facility]

## 2024-03-23 ENCOUNTER — Other Ambulatory Visit: Payer: Self-pay | Admitting: Family Medicine

## 2024-03-23 DIAGNOSIS — Z1501 Genetic susceptibility to malignant neoplasm of breast: Secondary | ICD-10-CM

## 2024-03-23 DIAGNOSIS — R898 Other abnormal findings in specimens from other organs, systems and tissues: Secondary | ICD-10-CM

## 2024-03-23 DIAGNOSIS — Z148 Genetic carrier of other disease: Secondary | ICD-10-CM

## 2024-03-23 NOTE — Progress Notes (Signed)
 Reviewed genetic screening results for Ms. Trejo-Mendiola's partner, Mr. Devonna Callas (DOB 09/0/1997).   He was screened for only the two conditions found on Ms. Trejo-Mendiola's carrier screening: ATM and Bloom Syndrome. He was negative for both.    Dorothyann Helling, MD 03/23/24  3:13 PM

## 2024-03-25 ENCOUNTER — Encounter: Payer: Self-pay | Admitting: Obstetrics

## 2024-03-25 ENCOUNTER — Ambulatory Visit: Admitting: Obstetrics

## 2024-03-25 ENCOUNTER — Other Ambulatory Visit (HOSPITAL_COMMUNITY)
Admission: RE | Admit: 2024-03-25 | Discharge: 2024-03-25 | Disposition: A | Discharge status: Home / Self Care | Source: Ambulatory Visit | Attending: Obstetrics | Admitting: Obstetrics

## 2024-03-25 VITALS — BP 118/65 | HR 90 | Wt 201.3 lb

## 2024-03-25 DIAGNOSIS — R87611 Atypical squamous cells cannot exclude high grade squamous intraepithelial lesion on cytologic smear of cervix (ASC-H): Secondary | ICD-10-CM | POA: Insufficient documentation

## 2024-03-25 DIAGNOSIS — N871 Moderate cervical dysplasia: Secondary | ICD-10-CM

## 2024-03-27 LAB — SURGICAL PATHOLOGY

## 2024-03-31 ENCOUNTER — Ambulatory Visit: Payer: Self-pay | Admitting: Obstetrics

## 2024-04-04 ENCOUNTER — Telehealth: Payer: Self-pay | Admitting: Family Medicine

## 2024-04-07 ENCOUNTER — Telehealth: Payer: Self-pay

## 2024-04-07 ENCOUNTER — Ambulatory Visit

## 2024-04-07 NOTE — Telephone Encounter (Signed)
 DNKA in CenteringPregnancy Cycle 6 on 04/07/24. Call to client and left message requesting she return call as needs [facility] RV appt since unable to keep appt 9/22 (and next Centering appt is 05/05/24). Number to call provided. Burnadette Lowers, RN

## 2024-04-09 NOTE — Telephone Encounter (Signed)
 Per EPIC appt desk, client has [facility] RV appt 04/14/24. Burnadette Lowers, RN

## 2024-04-14 ENCOUNTER — Ambulatory Visit: Admitting: Physician Assistant

## 2024-04-14 VITALS — BP 107/71 | HR 93 | Temp 97.7°F | Wt 201.2 lb

## 2024-04-14 DIAGNOSIS — Z3A22 22 weeks gestation of pregnancy: Secondary | ICD-10-CM

## 2024-04-14 DIAGNOSIS — O99212 Obesity complicating pregnancy, second trimester: Secondary | ICD-10-CM

## 2024-04-14 DIAGNOSIS — O99210 Obesity complicating pregnancy, unspecified trimester: Secondary | ICD-10-CM

## 2024-04-14 DIAGNOSIS — Z3480 Encounter for supervision of other normal pregnancy, unspecified trimester: Secondary | ICD-10-CM

## 2024-04-14 DIAGNOSIS — Z8759 Personal history of other complications of pregnancy, childbirth and the puerperium: Secondary | ICD-10-CM

## 2024-04-14 DIAGNOSIS — R87619 Unspecified abnormal cytological findings in specimens from cervix uteri: Secondary | ICD-10-CM

## 2024-04-14 DIAGNOSIS — Z3482 Encounter for supervision of other normal pregnancy, second trimester: Secondary | ICD-10-CM

## 2024-04-14 NOTE — Progress Notes (Signed)
 [facility] [address], [address] [place] [address] Main phone: [phone]  Prenatal Visit  Subjective:  Crystal Perez is a 23 y.o. G3P1011 at [redacted]w[redacted]d being seen today for ongoing prenatal care.  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  Contractions: Not present. Vag. Bleeding: None.  Movement: Present. Denies leaking of fluid/ROM.   The following portions of the patient's history were reviewed and updated as appropriate: allergies, current medications, past family history, past medical history, past social history, past surgical history and problem list. Problem list updated.  Objective:   Vitals:   04/14/24 1634  BP: 107/71  Pulse: 93  Temp: 97.7 F (36.5 C)  Weight: 201 lb 3.2 oz (91.3 kg)    Fetal Status: Fetal Heart Rate (bpm): 144 Fundal Height: 23 cm Movement: Present     General:  Alert, oriented and cooperative. Patient is in no acute distress.  Skin: Skin is warm and dry. No rash noted.   Cardiovascular: Normal heart rate noted  Respiratory: Normal respiratory effort, no problems with respiration noted  Abdomen: Soft, gravid, appropriate for gestational age.  Pain/Pressure: Absent     Pelvic: Cervical exam deferred        Extremities: Normal range of motion.  Edema: None  Mental Status: Normal mood and affect.  Normal behavior. Normal judgment and thought content.   Assessment and Plan:  Pregnancy: G3P1011 at [redacted]w[redacted]d  1. [redacted] weeks gestation of pregnancy (Primary) RV at next CP session 05/05/24.  2. Prenatal care, subsequent pregnancy, antepartum Continue prenatal vitamins. Enc to keep fetal anat US  as sched 04/16/24.  3. History of pre-eclampsia Reviewed home BP log with a number of elevated readings. Systolic range 127-145, diastolic range 76-93. BP here today normal at 107/71. Instructed patient to bring BP equipment to next CP session for correlation with clinic equipment.  4. Obesity affecting pregnancy, antepartum, unspecified obesity type TWG 1 lb. Pt states good appetite and po intake. Has met with nutritionist for MNT. Continue daily aspirin .  5. Abnormal cervical Papanicolaou smear, unspecified abnormal pap finding Had colpo at [facility] 03/25/24 showing pre-cancerous cells and they recommended repeat colpo in 3 mo. Counseled pt re: this recommendation and updated problem list.   Preterm labor symptoms and general obstetric precautions including but not limited to vaginal bleeding, contractions, leaking of fluid and fetal movement were reviewed in detail with the patient. Please refer to After Visit Summary for other counseling recommendations.  Return in about 3 weeks (around 05/05/2024) for Routine prenatal care as scheduled for CP Cycle 6.  Future Appointments  Date Time Provider Department Center  05/05/2024  1:30 PM AC-MH PROVIDER AC-MAT None  05/19/2024  1:30 PM AC-MH PROVIDER AC-MAT None  06/02/2024  1:30 PM AC-MH PROVIDER AC-MAT None  06/16/2024  1:30 PM AC-MH PROVIDER AC-MAT None  06/30/2024  1:30 PM AC-MH PROVIDER AC-MAT None  07/14/2024  1:30 PM AC-MH PROVIDER AC-MAT None  Tashae Inda, PA-C

## 2024-04-14 NOTE — Progress Notes (Signed)
 Colpo completed by Estil Mangle MD with [facility] on 03/25/24.  Reference surgical pathology lab result in Epic dated 03/25/24.  Copied and pasted from Dr. Marchia 03/31/24 follow-up notes: Your biopsy came back showing pre-cancerous cells. While you are pregnant, we do not remove or treat these unless it gets very concerning; we recommend you return in 3 months so we can take another look and ensure everything is staying stable. Our office will be calling you to schedule a follow up colposcopy for 3 mos from now.

## 2024-04-14 NOTE — Progress Notes (Signed)
 Plans to continue with CenteringPregnancy Cycle 6 and aware of next appt on 05/05/24 at 1230. Verified has [facility] contact card. Aware of 04/16/24 0800 [facility] US  appt at [facility]. Home BP log copied and in outguide for provider review. Counseled on flu vaccine recommendation in pregnancy and desires at October RV (noted on pink sticky). Kept colposcopy appt 03/25/24 and states does not understand My Chart message. Counseled provider would explain results today. Burnadette Lowers, RN

## 2024-05-05 ENCOUNTER — Telehealth: Payer: Self-pay

## 2024-05-05 ENCOUNTER — Ambulatory Visit

## 2024-05-05 NOTE — Telephone Encounter (Signed)
 Call to client to schedule [facility] RV as DNKA in Centering today. Per client, needs to change to traditional clinic as having childcare issue and no longer able to participate in Centering. Leita Kleine, CenteringPregnancy Coordinator notified via e-mail of above. [facility] RV appt scheduled for 05/06/24. Burnadette Lowers, RN

## 2024-05-06 ENCOUNTER — Telehealth: Payer: Self-pay

## 2024-05-06 ENCOUNTER — Ambulatory Visit: Admitting: Family Medicine

## 2024-05-06 ENCOUNTER — Ambulatory Visit

## 2024-05-06 ENCOUNTER — Encounter: Payer: Self-pay | Admitting: Family Medicine

## 2024-05-06 VITALS — BP 126/78 | HR 79 | Temp 97.0°F | Wt 204.2 lb

## 2024-05-06 DIAGNOSIS — O99212 Obesity complicating pregnancy, second trimester: Secondary | ICD-10-CM

## 2024-05-06 DIAGNOSIS — Z23 Encounter for immunization: Secondary | ICD-10-CM | POA: Diagnosis not present

## 2024-05-06 DIAGNOSIS — Z8759 Personal history of other complications of pregnancy, childbirth and the puerperium: Secondary | ICD-10-CM

## 2024-05-06 DIAGNOSIS — Z3482 Encounter for supervision of other normal pregnancy, second trimester: Secondary | ICD-10-CM

## 2024-05-06 DIAGNOSIS — Z3A25 25 weeks gestation of pregnancy: Secondary | ICD-10-CM

## 2024-05-06 DIAGNOSIS — O99210 Obesity complicating pregnancy, unspecified trimester: Secondary | ICD-10-CM

## 2024-05-06 DIAGNOSIS — Z3480 Encounter for supervision of other normal pregnancy, unspecified trimester: Secondary | ICD-10-CM

## 2024-05-06 NOTE — Progress Notes (Unsigned)
 Here today for 25.2 week MH RV. Taking PNV and ASA every day. States is checking BP at times. Denis ED/hospital visits since last RV. PTL s/s info given and reviewed. Accepts Flu vaccine. Niels Devonshire, RN

## 2024-05-06 NOTE — Telephone Encounter (Signed)
 Per [facility] EPIC appt desk, client rescheduled am appt today for this pm. Burnadette Lowers, RN

## 2024-05-06 NOTE — Telephone Encounter (Signed)
 DNKA for [facility] RV 05/06/24. Call to client and left message requesting she reschedule appt with number to call provided. Also left message that at current time, 2 available appts this afternoon open. Burnadette Lowers, RN

## 2024-05-06 NOTE — Progress Notes (Unsigned)
 [facility] [address] [place] [address] Main phone: [phone]  Prenatal Visit  Subjective:  Crystal Perez is a 23 y.o. G3P1011 at [redacted]w[redacted]d being seen today for ongoing prenatal care.  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 Pt Denies leaking of fluid/ROM.   The following portions of the patient's history were reviewed and updated as appropriate: allergies, current medications, past family history, past medical history, past social history, past surgical history and problem list. Problem list updated.  Objective:   Vitals:   05/06/24 1423  BP: 126/78  Pulse: 79  Temp: (!) 97 F (36.1 C)  Weight: 204 lb 3.2 oz (92.6 kg)    Fetal Status: Fetal Heart Rate (bpm): 132 Fundal Height: 26 cm Movement: Present     General:  Alert, oriented and cooperative. Patient is in no acute distress.  Skin: Skin is warm and dry. No rash noted.   Cardiovascular: Normal heart rate noted  Respiratory: Normal respiratory effort, no problems with respiration noted  Abdomen: Soft, gravid, appropriate for gestational age.  Pain/Pressure: Absent     Pelvic: Cervical exam deferred        Extremities: Normal range of motion.   Edema: None  Mental Status: Normal mood and affect. Normal behavior. Normal judgment and thought content.   Assessment and Plan:  Pregnancy: G3P1011 at [redacted]w[redacted]d  1. [redacted] weeks gestation of pregnancy (Primary) -RV in 4 weeks.   2. Prenatal care, subsequent pregnancy, antepartum Pt continues PNV without concerns.  Anatomy scan reviewed from 04/16/14 at [facility] EFW 51%, cephalic, posterior placenta, AFV WNL.   - Flu vaccine trivalent PF, 6mos and older(Flulaval,Afluria,Fluarix,Fluzone)  3. History of pre-eclampsia -has been taking BP at home in the evening- reported values: 04/16/24- 110/ 72 04/24/24- 143/85 05/01/24-148/ 84 -BP values on home cuff concerning however in office value today wnl -pt brought cuff with her today- rechecked on home cuff and consistent with office value -reviewed how to take home BP- encouraged to take in the morning at the same time -will bring back log and reassess in 1 week -given ER precautions regarding chest heaviness:  symptoms get worse develop into chest pain or tightness, pt has shortness of breath, dizziness, or blurred vision to report to the emergency department  4. Obesity affecting pregnancy, antepartum, unspecified obesity type TWG: 4 lb 3.2 oz (1.905 kg) -has meet with nutritionist -continue ASA 81mg    Preterm labor symptoms and general obstetric precautions including but not limited to vaginal bleeding, contractions, leaking of fluid and fetal movement were reviewed in detail with the patient. Please refer to After Visit Summary for other counseling recommendations.  Return in about 1 week (around 05/13/2024), or Review BP and assess Home BP Log.  Hardin Pouch, [facility], MPH  Attestation of Supervision of Advanced Practitioner (CNM/PA/NP): Evaluation  and management procedures were performed by the Advanced Practice Provider under my supervision and collaboration.  I have reviewed the Advanced Practice Provider's note and chart, and I agree with the  management and plan. I have also made any necessary editorial changes.   I was working along side this practitioner all day and all medical plans were discussed with me.   Verneta Bers, [place]

## 2024-05-19 ENCOUNTER — Ambulatory Visit

## 2024-06-02 ENCOUNTER — Ambulatory Visit

## 2024-06-03 ENCOUNTER — Encounter: Payer: Self-pay | Admitting: Physician Assistant

## 2024-06-03 ENCOUNTER — Ambulatory Visit: Admitting: Physician Assistant

## 2024-06-03 VITALS — BP 122/81 | HR 93 | Temp 97.0°F | Wt 205.2 lb

## 2024-06-03 DIAGNOSIS — O99210 Obesity complicating pregnancy, unspecified trimester: Secondary | ICD-10-CM

## 2024-06-03 DIAGNOSIS — Z8759 Personal history of other complications of pregnancy, childbirth and the puerperium: Secondary | ICD-10-CM

## 2024-06-03 DIAGNOSIS — Z3A29 29 weeks gestation of pregnancy: Secondary | ICD-10-CM

## 2024-06-03 DIAGNOSIS — Z23 Encounter for immunization: Secondary | ICD-10-CM | POA: Diagnosis not present

## 2024-06-03 DIAGNOSIS — O99213 Obesity complicating pregnancy, third trimester: Secondary | ICD-10-CM

## 2024-06-03 DIAGNOSIS — Z3480 Encounter for supervision of other normal pregnancy, unspecified trimester: Secondary | ICD-10-CM

## 2024-06-03 DIAGNOSIS — Z3483 Encounter for supervision of other normal pregnancy, third trimester: Secondary | ICD-10-CM

## 2024-06-03 LAB — HEMOGLOBIN, FINGERSTICK: Hemoglobin: 12.1 g/dL (ref 11.1–15.9)

## 2024-06-03 MED ORDER — ASPIRIN 81 MG PO TBEC: 81.0000 mg | DELAYED_RELEASE_TABLET | Freq: Every day | ORAL | Status: AC

## 2024-06-03 NOTE — Progress Notes (Signed)
 Hgb result reviewed during visit. BTHIELE RN

## 2024-06-03 NOTE — Progress Notes (Signed)
 [facility] [address], [address] [place] [address] Main phone: [phone]  Prenatal Visit  Subjective:  Crystal Perez is a 23 y.o. G3P1011 at [redacted]w[redacted]d being seen today for ongoing prenatal care.  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 She rested, drank water and laid on her left side and went to sleep when she woke up felt better. She was very active the day before she experienced the pelvic pain with a lot of up/down movements, grocery shopping and a lot of walking. She additionally endorses lower back pain. She had one episode of shortness of breath on 05/17/24 but no further episodes since.  Pt Denies leaking of fluid/ROM, or vaginal bleeding.  The following portions of the patient's history were reviewed and updated as appropriate: allergies, current medications, past family history, past medical history, past social history, past surgical history and problem list. Problem list updated.  Objective:   Vitals:   06/03/24 0838  BP: 122/81  Pulse: 93  Temp: (!) 97 F (36.1 C)  Weight: 205 lb 3.2 oz (93.1 kg)    Fetal Status: Fetal Heart Rate (bpm): 142 Fundal Height: 30 cm Movement: Present     General:  Alert, oriented and cooperative. Patient is in no acute distress.  Skin:  Skin is warm and dry. No rash noted.   Cardiovascular: Normal heart rate noted  Respiratory: Normal respiratory effort, no problems with respiration noted  Abdomen: Soft, gravid, appropriate for gestational age.  Pain/Pressure: Present (lower back pain)     Pelvic: Cervical exam deferred        Extremities: Normal range of motion.  Edema: None  Mental Status: Normal mood and affect. Normal behavior. Normal judgment and thought content.   Assessment and Plan:  Pregnancy: G3P1011 at [redacted]w[redacted]d  1. [redacted] weeks gestation of pregnancy (Primary) -RV in 2 weeks.   2. Prenatal care, subsequent pregnancy, antepartum TWG: 5 lb 3.2 oz (2.359 kg)  Has gained 1 lbs in 4 weeks.    -Continues to take PNV without concerns.  Fetal movement noted, fundal height appropriate for GA.   -No shortness of breath since 05/17/24, given proper ED precautions if follow-symptoms present chest tightness, pain, shortness of breath, dizziness or blurry vision to seek emergency services.   - Recommended use of stretches, positioning changes, pillows such as (mom-cozy pillow), Tylenol  PRN for pelvic pain and lower back discomfort relief.   3. History of pre-eclampsia -05/13/24: 114/75 -05/17/24:  130/79 (trouble breathing) before taking BP -05/21/24: 111/70 -05/22/24: 121/79 -05/25/24: 123/75 -05/28/24: 120/72 -05/31/24: 115/73 06/02/24: 129/87  - BP stable during visit today. Pt denies vision changes, dizziness, headache, or RUQ. -Will continue to monitor at prenatal visits. -Continues to take low-dose ASA everyday.  4. Obesity affecting pregnancy, antepartum, unspecified obesity type 5 lb 3.2 oz (2.359 kg)   -Continues to take low-dose ASA without concerns.  Preterm labor symptoms and general obstetric precautions including but not limited to vaginal bleeding, contractions, leaking of fluid and fetal movement were reviewed in detail with the patient. Please refer to After Visit Summary for other counseling  recommendations.  Return in about 2 weeks (around 06/17/2024) for routine prenatal care.  No future appointments.   Hardin GORMAN Pouch, NP

## 2024-06-04 ENCOUNTER — Ambulatory Visit: Payer: Self-pay | Admitting: Family Medicine

## 2024-06-04 DIAGNOSIS — Z3480 Encounter for supervision of other normal pregnancy, unspecified trimester: Secondary | ICD-10-CM

## 2024-06-04 LAB — GLUCOSE, 1 HOUR GESTATIONAL: Gestational Diabetes Screen: 124 mg/dL (ref 70–139)

## 2024-06-04 LAB — RPR: RPR Ser Ql: NONREACTIVE

## 2024-06-04 NOTE — Progress Notes (Signed)
 Reviewed routine 28 week labs. Negative 1 hr GTT, RPR, and anemia screen. No action needed at this time. To be discussed at next routine prenatal appointment.   Awaiting Hiv results.   Dorothyann Helling, MD 06/04/24  9:47 AM

## 2024-06-05 LAB — HIV-1/HIV-2 QUALITATIVE RNA
HIV-1 RNA, Qualitative: NONREACTIVE
HIV-2 RNA, Qualitative: NONREACTIVE

## 2024-06-05 NOTE — Progress Notes (Signed)
 HIV negative.   Crystal Helling, MD 06/05/24  9:29 AM

## 2024-06-16 ENCOUNTER — Ambulatory Visit

## 2024-06-19 ENCOUNTER — Encounter: Payer: Self-pay | Admitting: Physician Assistant

## 2024-06-23 ENCOUNTER — Telehealth: Payer: Self-pay

## 2024-06-23 ENCOUNTER — Ambulatory Visit

## 2024-06-23 NOTE — Telephone Encounter (Signed)
 DNKA for [facility] RV 06/23/24. Call to client and left message to call and reschedule missed appt. Number to call provided. Burnadette Lowers, RN

## 2024-06-24 NOTE — Telephone Encounter (Signed)
 Call to client and left message to call and reschedule missed appt. Number provided. Consuelo Kingsley RN

## 2024-06-30 ENCOUNTER — Ambulatory Visit

## 2024-07-01 ENCOUNTER — Ambulatory Visit

## 2024-07-01 ENCOUNTER — Encounter: Payer: Self-pay | Admitting: Nurse Practitioner

## 2024-07-01 VITALS — BP 121/80 | HR 73 | Temp 97.1°F | Wt 209.0 lb

## 2024-07-01 DIAGNOSIS — O99210 Obesity complicating pregnancy, unspecified trimester: Secondary | ICD-10-CM

## 2024-07-01 DIAGNOSIS — Z8759 Personal history of other complications of pregnancy, childbirth and the puerperium: Secondary | ICD-10-CM

## 2024-07-01 DIAGNOSIS — Z3A33 33 weeks gestation of pregnancy: Secondary | ICD-10-CM

## 2024-07-01 DIAGNOSIS — Z3483 Encounter for supervision of other normal pregnancy, third trimester: Secondary | ICD-10-CM

## 2024-07-01 DIAGNOSIS — Z3480 Encounter for supervision of other normal pregnancy, unspecified trimester: Secondary | ICD-10-CM

## 2024-07-01 DIAGNOSIS — O99213 Obesity complicating pregnancy, third trimester: Secondary | ICD-10-CM

## 2024-07-01 NOTE — Progress Notes (Signed)
 Taking BP weekly and has values on her phone. Verified has [facility] contact card. Counseled regarding RSV and desires at next [facility] RV appt. Burnadette Lowers, RN

## 2024-07-01 NOTE — Progress Notes (Signed)
 [facility] [address], [address] [place] [address] Main phone: [phone]  Prenatal Visit  Subjective:  Crystal Perez is a 23 y.o. G3P1011 at [redacted]w[redacted]d being seen today for ongoing prenatal care.  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 No other concerns today.  Contractions: Not present. Vag. Bleeding: None.  Movement: Present. Pt Denies leaking of fluid/ROM.   The following portions of the patient's history were reviewed and updated as appropriate: allergies, current medications, past family history, past medical history, past social history, past surgical history and problem list. Problem list updated.  Objective:   Vitals:   07/01/24 0826  BP: 121/80  Pulse: 73  Temp: (!) 97.1 F (36.2 C)  Weight: 209 lb (94.8 kg)   Total weight gain from pre-pregnancy weight: 9 lb (4.082 kg)  Fetal Status: Fetal Heart Rate (bpm): 135 Fundal Height: 32 cm Movement: Present    Fundal height trends reviewed - appropriate for EGA  General:  Alert, oriented and cooperative. Patient is in no acute distress.  Skin: Skin is warm and dry. No rash noted.   Cardiovascular: Normal heart rate noted  Respiratory: Normal respiratory effort, no problems with respiration noted  Abdomen: Soft, gravid,  appropriate for gestational age.  Pain/Pressure: Absent     Pelvic: Cervical exam deferred        Extremities: Normal range of motion.  Edema: None  Mental Status: Normal mood and affect. Normal behavior. Normal judgment and thought content.   Assessment and Plan:  Pregnancy: G3P1011 at [redacted]w[redacted]d  1. [redacted] weeks gestation of pregnancy (Primary) -RV 2 weeks  2. Prenatal care, subsequent pregnancy, antepartum  -Pt doing well. -Continues to take PNV without concerns.   -Reviewed colostrum and it's provided benefits to baby, discussed appropriate timing for when colostrum begins to come in. Rec waiting to pump and store milk until the time of colostrum production.   -Fetal movement noted, fundal height appropriate for GA. No concerns at visit today.   3. History of pre-eclampsia - Continues to take BP without concerns, the following BP log provided in clinic today:  - 06/09/24 - 127/79 -06/12/24 - 135/76 -06/20/24- 125/78 06/22/24-127/86 06/25/24- 133/85 06/29/24- 126/72  BP in clinic was 121/80 WNL.  She denies any present vision changes, dizziness, HA, or RUQ pain.  -ED precautions given if patient experiencing worsening episodes of spotty vision, pt expressed understanding,   4. Obesity affecting pregnancy, antepartum, unspecified obesity type TWG: 9 lb (4.082 kg)  Has gained 4  lbs in 2 weeks.    Preterm labor symptoms and general obstetric precautions including but not limited to vaginal bleeding, contractions, leaking of fluid and fetal movement were reviewed in detail with the patient. Please refer to After Visit Summary for other counseling recommendations.  No follow-ups on file.  Future Appointments  Date Time Provider Department Center  07/15/2024  3:00 PM [facility] PROVIDER  [facility] None    Hardin GORMAN Pouch, NP

## 2024-07-14 ENCOUNTER — Ambulatory Visit

## 2024-07-15 ENCOUNTER — Ambulatory Visit

## 2024-07-15 ENCOUNTER — Telehealth: Payer: Self-pay

## 2024-07-15 NOTE — Telephone Encounter (Signed)
 Client missed her [facility] RV today. Now rescheduled for 07/22/24 at 10:00.Hulan Parish, RN

## 2024-07-17 NOTE — L&D Delivery Note (Signed)
 VAGINAL DELIVERY NOTE   Pre-Delivery Diagnosis: 1. [redacted]w[redacted]d pregnancy 2. gHTN 3. GBS+ treated with penicillin   Post-Delivery Diagnosis: Same; delivered  Delivery Type: Vaginal, Spontaneous Delivery Clinician: ONEIL CHARLEEN Rolande Oneil, MD Delivery Anesthesia: Epidural  Labor Course: Patient was admitted for spontaneous labor with normal labor course.  Fetus had variable decelerations and an IUPC was placed for fetal monitoring and amnioinfusion. OB fast called for fetal bradycardia that resolved with ephedrine given by anesthesia. Membranes were ruptured  Artificial at 8:06 PM. Amniotic fluid was Clear. She became completely dilated at 1:41 AM.   She started pushing at 07/28/2024  1:42 AM.  With continued good pushing efforts, the infant's head was delivered. There was no nuchal cord. The shoulders and body followed with ease. Cord clamping was delayed 30 seconds, and then the cord was clamped and cut. The infant was placed on the maternal abdomen. Pitocin was then started.  With gentle cord traction, the placenta was delivered as described below.   Postpartum hemorrhage was not noted.  The sponge, lap, and needle counts were correct and verified with the nurse in attendance.  Lacerations: None, repaired in standard fashion.  Quantitative Blood Loss:  pending  Findings: 1) female infant, Apgar scores of 8   at 1 minute and 9   at 5 minutes and birthweight pending per protocol   Placenta: Appearance: Intact Removal: Spontaneous   Disposition: Discarded  Blood gases sent? no  Delivery Complications: none.  Neonatal Disposition: Maternal skin-to-skin.  Pregnancy risk: The risk of the pregnancy was high.  Student Involvement: None  Ermalinda Dries, MD 07/28/2024  2:21 AM   Family Medicine [facility] Attending Attestation: Procedure Attestation: I was present for the entirety of the procedure(s). I agree with note as documented by the resident.  Ermalinda Dries, MD

## 2024-07-18 ENCOUNTER — Telehealth: Payer: Self-pay | Admitting: Family Medicine

## 2024-07-18 NOTE — Telephone Encounter (Signed)
 Received phone message that patient stated she is experiencing a heavy feeling in her chest area and is having trouble breathing, wanted to know should she go to ER. Returned phone call and left voice mail patient to go to ER and to call [facility] if has additional questions. Number provided. BTHIELE RN

## 2024-07-22 ENCOUNTER — Ambulatory Visit: Admitting: Family Medicine

## 2024-07-22 VITALS — BP 129/89 | HR 94 | Temp 97.1°F | Wt 214.2 lb

## 2024-07-22 DIAGNOSIS — Z3A36 36 weeks gestation of pregnancy: Secondary | ICD-10-CM

## 2024-07-22 DIAGNOSIS — O133 Gestational [pregnancy-induced] hypertension without significant proteinuria, third trimester: Secondary | ICD-10-CM | POA: Diagnosis not present

## 2024-07-22 DIAGNOSIS — Z3483 Encounter for supervision of other normal pregnancy, third trimester: Secondary | ICD-10-CM

## 2024-07-22 DIAGNOSIS — Z3480 Encounter for supervision of other normal pregnancy, unspecified trimester: Secondary | ICD-10-CM

## 2024-07-22 NOTE — Addendum Note (Signed)
 Addended by: OMEGA MILLER A on: 07/22/2024 04:43 PM   Modules accepted: Orders

## 2024-07-22 NOTE — Progress Notes (Signed)
 Self collected 36 week specimens. NST performed. BTHIELE RN

## 2024-07-22 NOTE — Progress Notes (Signed)
" °  [facility] [address], [address] [place] [address] Main phone: [phone]  Prenatal Visit  Subjective:  Crystal Perez is a 24 y.o. G3P1011 at [redacted]w[redacted]d being seen today for ongoing prenatal care.  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  Contractions: Not present. Vag. Bleeding: None.  Movement: Present. Denies leaking of fluid/ROM.   The following portions of the patient's history were reviewed and updated as appropriate: allergies, current medications, past family history, past medical history, past social history, past surgical history and problem list. Problem list updated.  Objective:   Vitals:   07/22/24 1038 07/22/24 1057  BP: (!) 137/94 129/89  Pulse: 86 94  Temp: (!) 97.1 F (36.2 C) (!) 97.1 F (36.2 C)  Weight: 214 lb 3.2 oz (97.2 kg) 214 lb 3.2 oz (97.2 kg)   Total weight gain from pre-pregnancy weight: 14 lb 3.2 oz (6.441 kg)  Fetal Status: Fetal Heart Rate (bpm): 158 Fundal Height: 36 cm Movement: Present  Presentation: Vertex Fundal height trends reviewed - appropriate for EGA  General:  Alert, oriented and cooperative. Patient is in no acute distress.  Skin: Skin is warm and dry. No rash noted.   Cardiovascular: Normal heart rate noted  Respiratory: Normal respiratory effort, no problems  with respiration noted  Abdomen: Soft, gravid, appropriate for gestational age.  Pain/Pressure: Absent     Pelvic: Cervical exam deferred        Extremities: Normal range of motion.  Edema: None  Mental Status: Normal mood and affect. Normal behavior. Normal judgment and thought content.   Assessment and Plan:  Pregnancy: G3P1011 at [redacted]w[redacted]d  1. [redacted] weeks gestation of pregnancy (Primary) -self collected 36 week cultures -reviewed weekly visits until delivery  2. Prenatal care, subsequent pregnancy, antepartum  - GBS Culture - Chlamydia/GC NAA, Confirmation  3. Gestational hypertension, third trimester -seen in ER on 1/2 - diagnosed with GHTN- will be induced around 37 weeks, per note US  ordered and IOL scheduled for 07/27/24 Initial BP elevated 137/94, repeat 129/89, continue to monitor -has BP cuff at home Reports occasional spots in vision -reports HA occasionally that improved with Tylenol  -denies edema or SOB  -per ACOG guidelines- needs AFS 2x weekly until delivery -NST today: wnl- reactive and reassuring for GA -repeat NST on Friday- RN to schedule    Preterm labor symptoms and general obstetric precautions including but not limited to vaginal bleeding, contractions, leaking of fluid and fetal movement were reviewed in detail with the patient. Please refer to After Visit Summary for other counseling recommendations.  Return in about 1 week (around 07/29/2024) for Routine Prenatal Care.  Future Appointments  Date Time Provider Department Center  07/25/2024  2:20 PM [facility] PROVIDER [facility] None    Verneta Bers, FNP  "

## 2024-07-25 ENCOUNTER — Ambulatory Visit: Payer: Self-pay | Admitting: Family Medicine

## 2024-07-25 ENCOUNTER — Ambulatory Visit

## 2024-07-25 VITALS — BP 124/88 | HR 86 | Temp 97.5°F | Wt 213.4 lb

## 2024-07-25 DIAGNOSIS — Z2911 Encounter for prophylactic immunotherapy for respiratory syncytial virus (RSV): Secondary | ICD-10-CM

## 2024-07-25 DIAGNOSIS — Z3480 Encounter for supervision of other normal pregnancy, unspecified trimester: Secondary | ICD-10-CM

## 2024-07-25 DIAGNOSIS — Z3483 Encounter for supervision of other normal pregnancy, third trimester: Secondary | ICD-10-CM

## 2024-07-25 DIAGNOSIS — B951 Streptococcus, group B, as the cause of diseases classified elsewhere: Secondary | ICD-10-CM

## 2024-07-25 DIAGNOSIS — O133 Gestational [pregnancy-induced] hypertension without significant proteinuria, third trimester: Secondary | ICD-10-CM

## 2024-07-25 DIAGNOSIS — Z3A36 36 weeks gestation of pregnancy: Secondary | ICD-10-CM

## 2024-07-25 LAB — CULTURE, BETA STREP (GROUP B ONLY): Strep Gp B Culture: POSITIVE — AB

## 2024-07-25 LAB — CHLAMYDIA/GC NAA, CONFIRMATION
Chlamydia trachomatis, NAA: NEGATIVE
Neisseria gonorrhoeae, NAA: NEGATIVE

## 2024-07-25 NOTE — Progress Notes (Signed)
-  Patient Presents for NST.  -Reactive, reassuring NST in clinic today, Scheduled for IOL on 07/27/24. -Reviewed NST strip with Verneta Bers, FNP and Dr. Dorothyann Helling.   Hardin Pouch, [facility]

## 2024-07-25 NOTE — Progress Notes (Signed)
 Reviewed 36 week labs. Positive GBS. Negative gonorrhea and chlamydia swabs. No action needed at this time. To be discussed at next routine prenatal appointment.   Dorothyann Helling, MD 07/25/2024  8:59 AM

## 2024-07-25 NOTE — Progress Notes (Signed)
 Per client, has not taken BP at home since last [facility] RV appt 07/22/24. Scheduled for NST today and initiated at ~ 1430. Client aware of [facility] IOL appt 07/27/24 and given [facility] IOL instruction sheet. Verified has [facility] contact card. Counseled 36 week GBS culture positive and  information pamphlet given. Counseled 36 week gonorrhea / chlamydia culture negative. Client encouraged to call [facility] after delivery to discuss 3 month follow up colposcopy from 03/2024 and she agrees to do so. Burnadette Lowers, RN Following review of NST strip, monitoring discontinued per verbal order of [facility] FNP-C. Burnadette Lowers, RN

## 2024-07-28 ENCOUNTER — Ambulatory Visit

## 2024-07-28 NOTE — Progress Notes (Signed)
 Discharged from OBIX bed.Hulan Parish, RN

## 2024-07-30 NOTE — Discharge Summary (Signed)
 ------------------------------------------------------------------------------- Attestation signed by Myra Wanda LABOR, MD at 07/30/24 1155 Family Medicine [facility] Attending Attestation: Discharge Attestation (resident involved): I saw and evaluated the patient, participating in the key portions of the service on the day of discharge. I reviewed the resident's note and agree with the discharge plans and disposition. I was available and supervised the time spent by the resident in work on the day of discharge. We spent 20 minutes in discharge day services. All documented time was specific to the E/M visit and does not include any procedures that may have been performed.  Wanda LABOR Myra, MD      -------------------------------------------------------------------------------  Family Medicine [facility] Postpartum Discharge Summary  Admit Date: 07/27/2024  Date of Delivery: 07/28/2024  Discharge Date: 07/30/2024  Discharge to: Home  Discharge Attending Physician: Wanda LABOR Myra, MD  Delivery Type: normal spontaneous vaginal delivery  EDD: 08/17/2024, by Ultrasound  Gestational Age at Delivery: Gestational Age: [redacted]w[redacted]d  Apgar Scores: 8   at 1 minute and 9   at 5 minutes  Birth Weight: 2235 g (4 lb 14.8 oz)  Prenatal Provider: [facility]  Prenatal Course/Complications: Problem List[1]  Delivery Clinician: ONEIL CHARLEEN SENSOR  Delivery Anesthesia: Epidural  Labor Complications: None  Delivery Complications: Fetal distress  Lacerations: None  Other Procedures: None  Hospital Course: The patient was admitted to labor and delivery on 07/27/2024 for IOL 2/2 gHTN.  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 Labor course was complicated by fetal bradycardia that improved with ephedrine.  Delivered vaginally at [redacted]w[redacted]d on 07/29/2023 with no complications.  Postpartum course was uncomplicated. During the postpartum period, met all post partum goals including pain  control by po medications, limited vaginal bleeding, taking good food and drink, ambulating well, and no problems urinating or defecating. Discharged home on PPD#2 in stable condition. Counseled on routine post partum care and to follow-up with prenatal provider in 2 weeks.   gHTN HELLP labs normal. Monitored blood pressure during postpartum period with all normal readings. Did not require lasix.    Postpartum Complications: None  Birth Control Planned at Discharge: [facility] at [facility]  Infant Feeding Method at Discharge: breast  Discharge Day Services: Patient seen and examined by attending and resident physician on day of discharge.  No acute events. No complaints. Blood pressure 119/77, pulse 95, temperature 36.9 C (98.4 F), temperature source Oral, resp. rate 18, height 162.6 cm (5' 4), weight 97 kg (213 lb 12.8 oz), last menstrual period 10/28/2023, SpO2 97%, currently breastfeeding. GEN: AAOx3, pleasant, normal affect ENT: Moist mucous membranes of the oral cavity, neck without masses.   CV: Pulse normal rate, regular rhythm. S1 and S2 normal, without any murmur, rub, or gallop.  Lungs : Clear to auscultation bilaterally, no retractions. Good air movement. Skin: no rashes or jaundice, acyanotic Abdomen: soft, non-tender and not distended, uterus firm below umbilicus Ext: No edema or erythema, good pedal pulses  Condition at Discharge: good  Discharge Medications:   Medication List    CONTINUE taking these medications    multivitamin, prenatal (folic acid-iron) 27-1 mg Tab    Counseling:  Vaginal bleeding, abdominal pain, fevers, breastfeeding, mastitis, Postpartum depression. gHTN and blood pressure return precautions.  More than 30 minutes spent in discharge counseling.   Follow-up: Lactation consult as needed PCP: Pcp, Information Unavailable None    For any concerns or questions, please contact the on-call [facility] (formerly [facility]) resident at  [phone] (pager) or [phone] (phone)       [  1] Patient Active Problem List Diagnosis   Pre-eclampsia in third trimester (HHS-HCC)   Vaginal delivery (HHS-HCC)   Encounter for procreative genetic counseling   Gestational hypertension, third trimester (HHS-HCC)

## 2024-08-01 ENCOUNTER — Ambulatory Visit

## 2024-08-12 ENCOUNTER — Encounter

## 2024-08-15 ENCOUNTER — Telehealth: Payer: Self-pay

## 2024-08-15 ENCOUNTER — Encounter

## 2024-08-15 NOTE — Telephone Encounter (Signed)
 TC to patient to reschedule missed appointment for 2 week PP BP check today. LM with number to call.Izetta Parish, RN

## 2024-09-09 ENCOUNTER — Ambulatory Visit
